# Patient Record
Sex: Female | Born: 1985 | Race: White | Hispanic: No | Marital: Married | Smoking: Never smoker
Health system: Southern US, Community
[De-identification: ages and names within clinical notes are randomized; demographics above are authoritative.]

## PROBLEM LIST (undated history)

## (undated) DIAGNOSIS — N83299 Other ovarian cyst, unspecified side: Secondary | ICD-10-CM

## (undated) HISTORY — PX: WISDOM TOOTH EXTRACTION: SHX21

---

## 2008-11-01 ENCOUNTER — Ambulatory Visit (HOSPITAL_COMMUNITY): Admission: RE | Admit: 2008-11-01 | Discharge: 2008-11-01 | Payer: Self-pay | Admitting: Obstetrics and Gynecology

## 2008-12-21 ENCOUNTER — Ambulatory Visit (HOSPITAL_COMMUNITY): Admission: RE | Admit: 2008-12-21 | Discharge: 2008-12-21 | Payer: Self-pay | Admitting: Gynecology

## 2009-09-18 ENCOUNTER — Inpatient Hospital Stay (HOSPITAL_COMMUNITY): Admission: AD | Admit: 2009-09-18 | Discharge: 2009-09-20 | Payer: Self-pay | Admitting: Obstetrics and Gynecology

## 2010-03-23 LAB — CBC
HCT: 26.6 % — ABNORMAL LOW (ref 36.0–46.0)
Hemoglobin: 11.8 g/dL — ABNORMAL LOW (ref 12.0–15.0)
MCH: 31.7 pg (ref 26.0–34.0)
MCH: 32.1 pg (ref 26.0–34.0)
MCHC: 34.3 g/dL (ref 30.0–36.0)
MCV: 92 fL (ref 78.0–100.0)
MCV: 92.3 fL (ref 78.0–100.0)
Platelets: 176 10*3/uL (ref 150–400)
RBC: 3.71 MIL/uL — ABNORMAL LOW (ref 3.87–5.11)
RBC: 3.9 MIL/uL (ref 3.87–5.11)
RDW: 13.8 % (ref 11.5–15.5)
WBC: 10.3 10*3/uL (ref 4.0–10.5)
WBC: 11.8 10*3/uL — ABNORMAL HIGH (ref 4.0–10.5)

## 2010-03-23 LAB — COMPREHENSIVE METABOLIC PANEL
Chloride: 104 mEq/L (ref 96–112)
GFR calc Af Amer: >60 mL/min (ref >60)
GFR calc non Af Amer: >60 mL/min (ref >60)
Glucose, Bld: 82 mg/dL (ref 70–99)
Potassium: 3.8 mEq/L (ref 3.5–5.1)
Sodium: 134 mEq/L — ABNORMAL LOW (ref 135–145)
Total Protein: 6 g/dL (ref 6.0–8.3)

## 2010-03-23 LAB — URINALYSIS, ROUTINE W REFLEX MICROSCOPIC
Bilirubin Urine: NEGATIVE
Glucose, UA: NEGATIVE mg/dL
Hgb urine dipstick: NEGATIVE
Protein, ur: NEGATIVE mg/dL
Specific Gravity, Urine: >1.03 — ABNORMAL HIGH (ref 1.005–1.030)
Urobilinogen, UA: 0.2 mg/dL (ref 0.0–1.0)
pH: 6 (ref 5.0–8.0)

## 2010-03-23 LAB — URIC ACID: Uric Acid, Serum: 5.3 mg/dL (ref 2.4–7.0)

## 2011-03-01 IMAGING — RF DG HYSTEROGRAM
8 series · 8 of 8 positions shown · non-contrast
Comparison: none

CLINICAL DATA: Infertility.

HYSTEROSALPINGOGRAM
TECHNIQUE: Hysterosalpingogram was performed by the ordering
physician under fluoroscopy.  Fluoroscopic images are submitted for
interpretation following the procedure.
Fluoroscopy Time:  1.9 minutes.

[Series 1: run · 1 of 1 slices shown (1 of 8)]
[im 1/1]
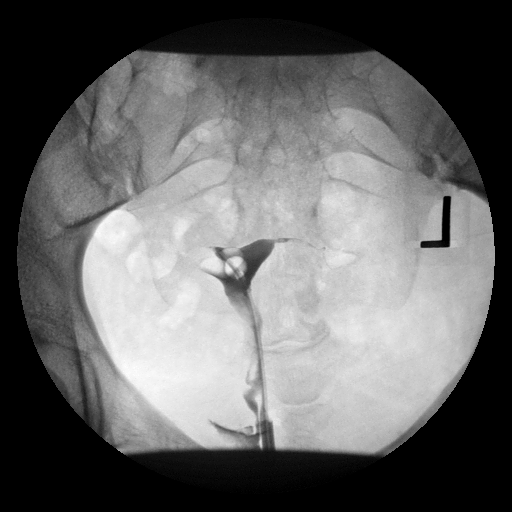

[Series 2: run · 1 of 1 slices shown (2 of 8)]
[im 1/1]
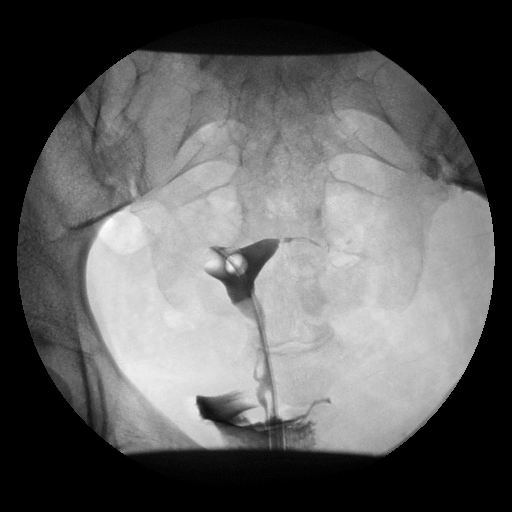

[Series 3: run · 1 of 1 slices shown (3 of 8)]
[im 1/1]
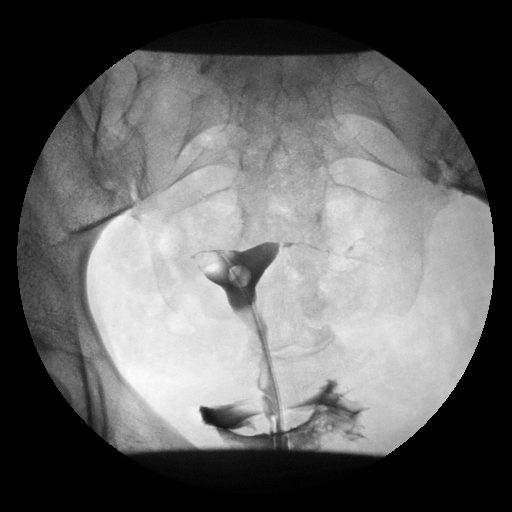

[Series 4: run · 1 of 1 slices shown (4 of 8)]
[im 1/1]
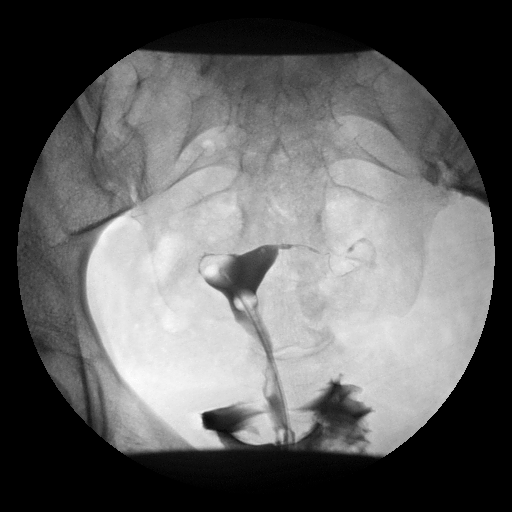

[Series 5: run · 1 of 1 slices shown (5 of 8)]
[im 1/1]
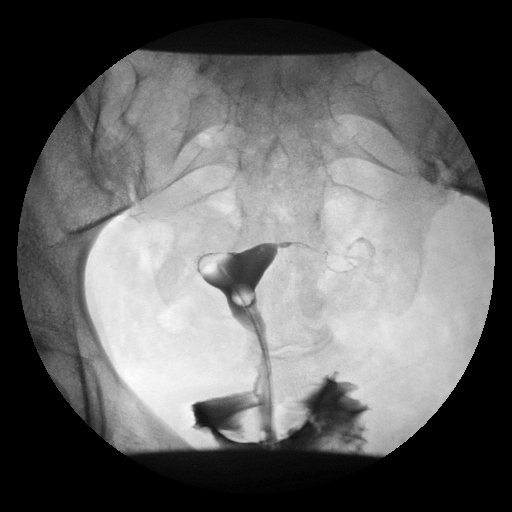

[Series 6: run · 1 of 1 slices shown (6 of 8)]
[im 1/1]
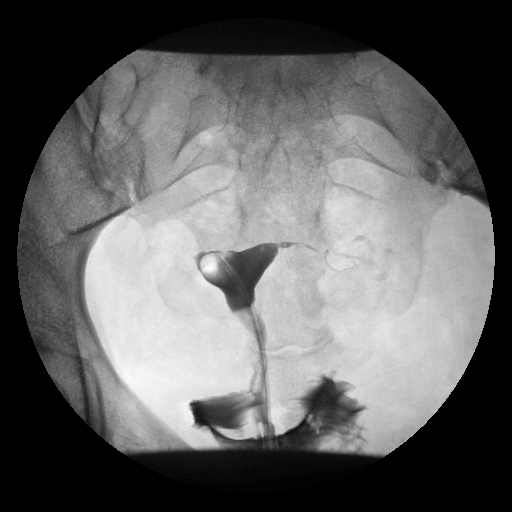

[Series 7: run · 1 of 1 slices shown (7 of 8)]
[im 1/1]
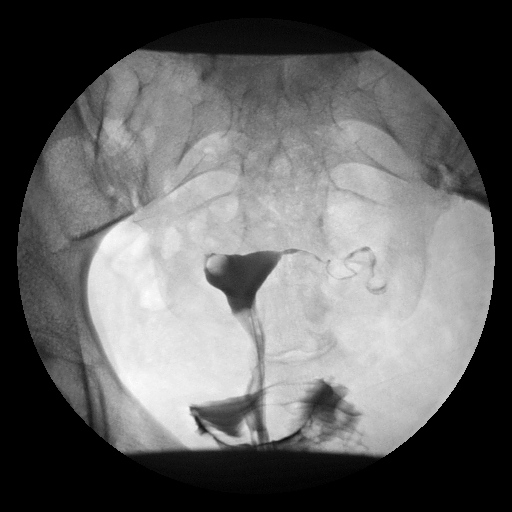

[Series 8: run · 1 of 1 slices shown (8 of 8)]
[im 1/1]
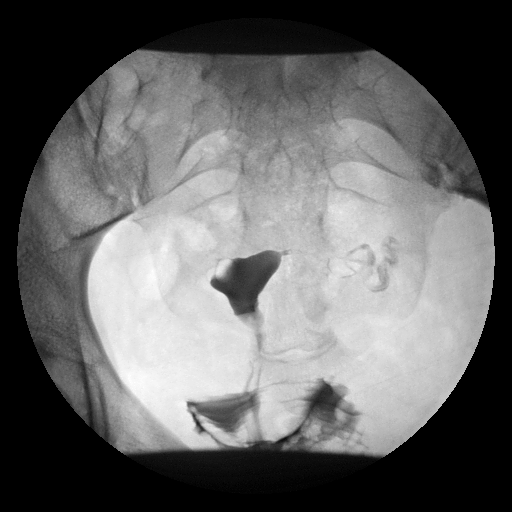

[8 of 8 positions shown; findings below may reference images not displayed]

FINDINGS: The catheter and a small balloon are seen within the
endometrial cavity.  There is a persistent, prominent lobulated
filling defect within the right aspect of the endometrial cavity at
the level of the fundus, and extending into the cornual region on
the right, seen on all images provided.  There is no opacification
of the right fallopian tube.

The left fallopian tube is opacified with contrast and appears
normal in caliber.  No definite free spill of contrast is seen from
the left fallopian tube on the images provided.
IMPRESSION: 1.  Persistent prominent filling defect in the fundal/cornual
region of the endometrial cavity, to the right of midline.
Considerations include submucosal fibroid, endometrial polyp, blood
clot, or less likely a large air bubble.  Consider correlation with
transvaginal pelvic ultrasound if not previously performed.
2.  Nonopacification of the right fallopian tube. The right
fallopian tube may be obstructed by the prominent filling defect
extending into the right cornua.
3.  Normal appearances of the left fallopian tube without definite
free intraperitoneal spill of contrast.  Suggest correlation with
real time findings.  Distal left fallopian tube occlusion is not
excluded.

## 2011-11-24 LAB — OB RESULTS CONSOLE GC/CHLAMYDIA
Chlamydia: NEGATIVE
Gonorrhea: NEGATIVE

## 2011-11-24 LAB — OB RESULTS CONSOLE ANTIBODY SCREEN: Antibody Screen: NEGATIVE

## 2011-11-24 LAB — OB RESULTS CONSOLE RUBELLA ANTIBODY, IGM: Rubella: IMMUNE

## 2012-01-09 NOTE — L&D Delivery Note (Signed)
Delivery Note  SVD viable female Apgars 9,9 over intact perineum.  Placenta delivered spontaneously intact with 3VC.  good support and hemostasis noted and R/V exam confirms.  PH art was sent.  [place] cord blood was not done.  Mother and baby were doing well.  EBL 300cc  Candice Camp, MD

## 2012-05-15 LAB — OB RESULTS CONSOLE GBS: GBS: NEGATIVE

## 2012-05-25 ENCOUNTER — Inpatient Hospital Stay (HOSPITAL_COMMUNITY)
Admission: AD | Admit: 2012-05-25 | Discharge: 2012-05-25 | Disposition: A | Discharge status: Home / Self Care | Payer: BC Managed Care – PPO | Source: Ambulatory Visit | Attending: Obstetrics and Gynecology | Admitting: Obstetrics and Gynecology

## 2012-05-25 ENCOUNTER — Encounter (HOSPITAL_COMMUNITY): Payer: Self-pay | Admitting: *Deleted

## 2012-05-25 DIAGNOSIS — O479 False labor, unspecified: Secondary | ICD-10-CM | POA: Insufficient documentation

## 2012-05-25 NOTE — MAU Note (Signed)
Pt states she is having contractons 5-7 min apart-states had a fast first labor

## 2012-05-31 ENCOUNTER — Encounter (HOSPITAL_COMMUNITY): Payer: Self-pay | Admitting: Family

## 2012-05-31 ENCOUNTER — Observation Stay (HOSPITAL_COMMUNITY)
Admission: AD | Admit: 2012-05-31 | Discharge: 2012-06-01 | DRG: 382 | Disposition: A | Discharge status: Home / Self Care | Payer: BC Managed Care – PPO | Source: Ambulatory Visit | Attending: Obstetrics and Gynecology | Admitting: Obstetrics and Gynecology

## 2012-05-31 DIAGNOSIS — O479 False labor, unspecified: Principal | ICD-10-CM | POA: Diagnosis present

## 2012-05-31 HISTORY — DX: Other ovarian cyst, unspecified side: N83.299

## 2012-05-31 LAB — CBC
HCT: 33.4 % — ABNORMAL LOW (ref 36.0–46.0)
Hemoglobin: 11.5 g/dL — ABNORMAL LOW (ref 12.0–15.0)
MCHC: 34.4 g/dL (ref 30.0–36.0)
WBC: 11.2 10*3/uL — ABNORMAL HIGH (ref 4.0–10.5)

## 2012-05-31 MED ORDER — OXYTOCIN BOLUS FROM INFUSION: 500.0000 mL | INTRAVENOUS | Status: DC

## 2012-05-31 MED ORDER — LIDOCAINE HCL (PF) 1 % IJ SOLN: 30.0000 mL | INTRAMUSCULAR | Status: DC | PRN

## 2012-05-31 MED ORDER — ACETAMINOPHEN 325 MG PO TABS: 650.0000 mg | ORAL_TABLET | ORAL | Status: DC | PRN

## 2012-05-31 MED ORDER — IBUPROFEN 600 MG PO TABS: 600.0000 mg | ORAL_TABLET | Freq: Four times a day (QID) | ORAL | Status: DC | PRN

## 2012-05-31 MED ORDER — ONDANSETRON HCL 4 MG/2ML IJ SOLN: 4.0000 mg | Freq: Four times a day (QID) | INTRAMUSCULAR | Status: DC | PRN

## 2012-05-31 MED ORDER — OXYCODONE-ACETAMINOPHEN 5-325 MG PO TABS: 1.0000 | ORAL_TABLET | ORAL | Status: DC | PRN

## 2012-05-31 MED ORDER — LACTATED RINGERS IV SOLN
INTRAVENOUS | Status: DC
  Administered 2012-05-31: 22:00:00 via INTRAVENOUS

## 2012-05-31 MED ORDER — CITRIC ACID-SODIUM CITRATE 334-500 MG/5ML PO SOLN: 30.0000 mL | ORAL | Status: DC | PRN

## 2012-05-31 MED ORDER — OXYTOCIN 40 UNITS IN LACTATED RINGERS INFUSION - SIMPLE MED: 62.5000 mL/h | INTRAVENOUS | Status: DC

## 2012-05-31 MED ORDER — LACTATED RINGERS IV SOLN: 500.0000 mL | INTRAVENOUS | Status: DC | PRN

## 2012-05-31 MED ORDER — FLEET ENEMA 7-19 GM/118ML RE ENEM: 1.0000 | ENEMA | RECTAL | Status: DC | PRN

## 2012-05-31 NOTE — MAU Note (Signed)
Patient presents to MAU with c/o contraction every 5 minutes for last four hours; denies vaginal bleeding, LOF. Reports +FM.  Denies hx of HSV or MRSA.

## 2012-06-01 NOTE — H&P (Signed)
Pam Hardy is a 27 y.o. female presenting for possible labor at 37+. Watched over night. The next morning cervix unchanged.  fhr cat 1  Maternal Medical History:  Reason for admission: Contractions.   Contractions: Frequency: irregular.    Fetal activity: Perceived fetal activity is normal.    Prenatal complications: no prenatal complications Prenatal Complications - Diabetes: none.    OB History   Grav Para Term Preterm Abortions TAB SAB Ect Mult Living   2 1        1      Past Medical History  Diagnosis Date  . Functional ovarian cysts    Past Surgical History  Procedure Laterality Date  . Wisdom tooth extraction     Family History: family history is not on file. Social History:  reports that she has never smoked. She has never used smokeless tobacco. She reports that she does not drink alcohol or use illicit drugs.   Prenatal Transfer Tool  Maternal Diabetes: No Genetic Screening: Normal Maternal Ultrasounds/Referrals: Normal Fetal Ultrasounds or other Referrals:  None Maternal Substance Abuse:  No Significant Maternal Medications:  None Significant Maternal Lab Results:  None Other Comments:  None  ROS  Dilation: 4 Effacement (%): Thick Station: -2 Exam by:: Dr. Arelia Sneddon Blood pressure 137/69, pulse 80, temperature 98.8 F (37.1 C), temperature source Oral, resp. rate 16, height 5\' 6"  (1.676 m), weight 90.719 kg (200 lb). Maternal Exam:  Uterine Assessment: Contraction strength is mild.  Contraction frequency is irregular.   Abdomen: Fundal height is c/w dates.   Estimated fetal weight is 7.   Fetal presentation: vertex  Pelvis: adequate for delivery.   Cervix: 4 cm long and posterior  Physical Exam  Prenatal labs: ABO, Rh: O/Positive/-- (11/16 0000) Antibody: Negative (11/16 0000) Rubella: Immune (11/16 0000) RPR: NON REACTIVE (05/24 2132)  HBsAg: Negative (11/16 0000)  HIV: Non-reactive (11/16 0000)  GBS: Negative (05/08 0000)    Assessment/Plan: IUP at 37+ with false labor Discharge home  [facility] S 06/01/2012, 9:44 AM

## 2012-06-01 NOTE — Progress Notes (Signed)
Dr.McComb called notified of pt's unchanged SVE, orders received that pt may walk

## 2012-06-01 NOTE — Progress Notes (Addendum)
Dr. Arelia Sneddon called nofitied of pt's SVE after walking, will come to see pt and evaluate

## 2012-06-01 NOTE — Discharge Summary (Signed)
Obstetric Discharge Summary Reason for Admission: onset of labor Prenatal Procedures: none Intrapartum Procedures: false labor sent home undelevered Postpartum Procedures: none Complications-Operative and Postpartum: none Hemoglobin  Date Value Range Status  05/31/2012 11.5* 12.0 - 15.0 g/dL Final     HCT  Date Value Range Status  05/31/2012 33.4* 36.0 - 46.0 % Final    Physical Exam:  General: alert Lochia: appropriate Uterine Fundus: c/w dates Incision: na DVT Evaluation: No evidence of DVT seen on physical exam.  Discharge Diagnoses: False labor-undelivered  Discharge Information: Date: 06/01/2012 Activity: unrestricted Diet: routine Medications: None Condition: stable Instructions: refer to practice specific booklet Discharge to: home   Newborn Data: This patient has no babies on file. Home with undelivered.  Pam Hardy S 06/01/2012, 9:47 AM

## 2012-06-09 ENCOUNTER — Telehealth (HOSPITAL_COMMUNITY): Payer: Self-pay | Admitting: *Deleted

## 2012-06-09 ENCOUNTER — Encounter (HOSPITAL_COMMUNITY): Payer: Self-pay | Admitting: *Deleted

## 2012-06-09 ENCOUNTER — Inpatient Hospital Stay (HOSPITAL_COMMUNITY)
Admission: AD | Admit: 2012-06-09 | Discharge: 2012-06-11 | DRG: 373 | Disposition: A | Discharge status: Home / Self Care | Payer: BC Managed Care – PPO | Source: Ambulatory Visit | Attending: Obstetrics and Gynecology | Admitting: Obstetrics and Gynecology

## 2012-06-09 NOTE — Telephone Encounter (Signed)
Preadmission screen  

## 2012-06-09 NOTE — MAU Note (Signed)
PT SAYS SHE HURT BAD SINCE 830PM.     TODAY IN OFFICE - VE  5 CM. AND HAS BEEN X1 WEEK.      DENIES HSV AND MRSA

## 2012-06-10 ENCOUNTER — Encounter (HOSPITAL_COMMUNITY): Payer: Self-pay | Admitting: Obstetrics

## 2012-06-10 LAB — CBC
HCT: 32.4 % — ABNORMAL LOW (ref 36.0–46.0)
Hemoglobin: 10.9 g/dL — ABNORMAL LOW (ref 12.0–15.0)
MCV: 82.9 fL (ref 78.0–100.0)
RBC: 3.91 MIL/uL (ref 3.87–5.11)
WBC: 9 10*3/uL (ref 4.0–10.5)

## 2012-06-10 MED ORDER — MEASLES, MUMPS & RUBELLA VAC ~~LOC~~ INJ: 0.5000 mL | INJECTION | Freq: Once | SUBCUTANEOUS | Status: DC

## 2012-06-10 MED ORDER — OXYCODONE-ACETAMINOPHEN 5-325 MG PO TABS: 1.0000 | ORAL_TABLET | ORAL | Status: DC | PRN

## 2012-06-10 MED ORDER — OXYTOCIN BOLUS FROM INFUSION: 500.0000 mL | INTRAVENOUS | Status: DC

## 2012-06-10 MED ORDER — LACTATED RINGERS IV SOLN: 500.0000 mL | INTRAVENOUS | Status: DC | PRN

## 2012-06-10 MED ORDER — PRENATAL MULTIVITAMIN CH
1.0000 | ORAL_TABLET | Freq: Every day | ORAL | Status: DC
  Administered 2012-06-10: 1 via ORAL
  Filled 2012-06-10: qty 1

## 2012-06-10 MED ORDER — PHENYLEPHRINE 40 MCG/ML (10ML) SYRINGE FOR IV PUSH (FOR BLOOD PRESSURE SUPPORT)
80.0000 ug | PREFILLED_SYRINGE | INTRAVENOUS | Status: DC | PRN
  Filled 2012-06-10: qty 2

## 2012-06-10 MED ORDER — DIBUCAINE 1 % RE OINT: 1.0000 "application " | TOPICAL_OINTMENT | RECTAL | Status: DC | PRN

## 2012-06-10 MED ORDER — OXYTOCIN 40 UNITS IN LACTATED RINGERS INFUSION - SIMPLE MED
62.5000 mL/h | INTRAVENOUS | Status: DC
  Administered 2012-06-10: 62.5 mL/h via INTRAVENOUS
  Filled 2012-06-10: qty 1000

## 2012-06-10 MED ORDER — LACTATED RINGERS IV SOLN: 500.0000 mL | Freq: Once | INTRAVENOUS | Status: DC

## 2012-06-10 MED ORDER — SIMETHICONE 80 MG PO CHEW: 80.0000 mg | CHEWABLE_TABLET | ORAL | Status: DC | PRN

## 2012-06-10 MED ORDER — IBUPROFEN 600 MG PO TABS: 600.0000 mg | ORAL_TABLET | Freq: Four times a day (QID) | ORAL | Status: DC | PRN

## 2012-06-10 MED ORDER — DIPHENHYDRAMINE HCL 50 MG/ML IJ SOLN: 12.5000 mg | INTRAMUSCULAR | Status: DC | PRN

## 2012-06-10 MED ORDER — LACTATED RINGERS IV SOLN
INTRAVENOUS | Status: DC
  Administered 2012-06-10: 03:00:00 via INTRAVENOUS

## 2012-06-10 MED ORDER — ONDANSETRON HCL 4 MG/2ML IJ SOLN: 4.0000 mg | Freq: Four times a day (QID) | INTRAMUSCULAR | Status: DC | PRN

## 2012-06-10 MED ORDER — LANOLIN HYDROUS EX OINT: TOPICAL_OINTMENT | CUTANEOUS | Status: DC | PRN

## 2012-06-10 MED ORDER — BENZOCAINE-MENTHOL 20-0.5 % EX AERO: 1.0000 "application " | INHALATION_SPRAY | CUTANEOUS | Status: DC | PRN

## 2012-06-10 MED ORDER — FLEET ENEMA 7-19 GM/118ML RE ENEM: 1.0000 | ENEMA | RECTAL | Status: DC | PRN

## 2012-06-10 MED ORDER — FENTANYL 2.5 MCG/ML BUPIVACAINE 1/10 % EPIDURAL INFUSION (WH - ANES): 14.0000 mL/h | INTRAMUSCULAR | Status: DC | PRN

## 2012-06-10 MED ORDER — ONDANSETRON HCL 4 MG/2ML IJ SOLN: 4.0000 mg | INTRAMUSCULAR | Status: DC | PRN

## 2012-06-10 MED ORDER — DIPHENHYDRAMINE HCL 25 MG PO CAPS: 25.0000 mg | ORAL_CAPSULE | Freq: Four times a day (QID) | ORAL | Status: DC | PRN

## 2012-06-10 MED ORDER — LIDOCAINE HCL (PF) 1 % IJ SOLN
30.0000 mL | INTRAMUSCULAR | Status: DC | PRN
  Filled 2012-06-10: qty 30

## 2012-06-10 MED ORDER — IBUPROFEN 600 MG PO TABS
600.0000 mg | ORAL_TABLET | Freq: Four times a day (QID) | ORAL | Status: DC
  Administered 2012-06-10 – 2012-06-11 (×3): 600 mg via ORAL
  Filled 2012-06-10 (×4): qty 1

## 2012-06-10 MED ORDER — EPHEDRINE 5 MG/ML INJ
10.0000 mg | INTRAVENOUS | Status: DC | PRN
  Filled 2012-06-10: qty 2

## 2012-06-10 MED ORDER — ZOLPIDEM TARTRATE 5 MG PO TABS: 5.0000 mg | ORAL_TABLET | Freq: Every evening | ORAL | Status: DC | PRN

## 2012-06-10 MED ORDER — ACETAMINOPHEN 325 MG PO TABS: 650.0000 mg | ORAL_TABLET | ORAL | Status: DC | PRN

## 2012-06-10 MED ORDER — MEDROXYPROGESTERONE ACETATE 150 MG/ML IM SUSP: 150.0000 mg | INTRAMUSCULAR | Status: DC | PRN

## 2012-06-10 MED ORDER — WITCH HAZEL-GLYCERIN EX PADS
1.0000 "application " | MEDICATED_PAD | CUTANEOUS | Status: DC | PRN
  Administered 2012-06-11: 1 via TOPICAL

## 2012-06-10 MED ORDER — PRENATAL MULTIVITAMIN CH: 1.0000 | ORAL_TABLET | Freq: Every day | ORAL | Status: DC

## 2012-06-10 MED ORDER — SENNOSIDES-DOCUSATE SODIUM 8.6-50 MG PO TABS
2.0000 | ORAL_TABLET | Freq: Every day | ORAL | Status: DC
  Administered 2012-06-10: 2 via ORAL

## 2012-06-10 MED ORDER — ONDANSETRON HCL 4 MG PO TABS: 4.0000 mg | ORAL_TABLET | ORAL | Status: DC | PRN

## 2012-06-10 MED ORDER — TETANUS-DIPHTH-ACELL PERTUSSIS 5-2.5-18.5 LF-MCG/0.5 IM SUSP: 0.5000 mL | Freq: Once | INTRAMUSCULAR | Status: DC

## 2012-06-10 MED ORDER — CITRIC ACID-SODIUM CITRATE 334-500 MG/5ML PO SOLN: 30.0000 mL | ORAL | Status: DC | PRN

## 2012-06-10 NOTE — H&P (Signed)
Pam Hardy is a 27 y.o. female presenting for labor.  Admiited in active labor with regular ctxs and 7 cm at 6:30  Pregnancy uncomplicated. History OB History   Grav Para Term Preterm Abortions TAB SAB Ect Mult Living   2 1 1       1      Past Medical History  Diagnosis Date  . Functional ovarian cysts    Past Surgical History  Procedure Laterality Date  . Wisdom tooth extraction     Family History: family history includes Cancer in her paternal aunt and paternal grandfather; Diabetes in her maternal grandfather; Heart attack in her cousin; Heart disease in her maternal grandfather; and Hyperthyroidism in her father. Social History:  reports that she has never smoked. She has never used smokeless tobacco. She reports that she does not drink alcohol or use illicit drugs.   Prenatal Transfer Tool  Maternal Diabetes: No Genetic Screening: Declined Maternal Ultrasounds/Referrals: Normal Fetal Ultrasounds or other Referrals:  None Maternal Substance Abuse:  No Significant Maternal Medications:  None Significant Maternal Lab Results:  None Other Comments:  None  ROS 32 Dilation: 5 Effacement (%): 80 Station: -2 Exam by:: Pam Hardy AROM clear fluid Blood pressure 135/81, pulse 80, temperature 98.4 F (36.9 C), temperature source Oral, resp. rate 18, height 5\' 6"  (1.676 m), weight 90.719 kg (200 lb). Exam Physical Exam  Prenatal labs: ABO, Rh: O/Positive/-- (11/16 0000) Antibody: Negative (11/16 0000) Rubella: Immune (11/16 0000) RPR: NON REACTIVE (05/24 2132)  HBsAg: Negative (11/16 0000)  HIV: Non-reactive (11/16 0000)  GBS: Negative (05/08 0000)   Assessment/Plan: IUP at term in active labor Anticipate SVD   Pam Hardy C 06/10/2012, 8:24 AM

## 2012-06-10 NOTE — Lactation Note (Signed)
This note was copied from the chart of Pam Hardy. Lactation Consultation Note  Patient Name: Pam Margherita Collyer [id number] Date: 06/10/2012 Reason for consult: Initial assessment Mom plans to pump and bottle feed. Her 1st baby did not latch well so she pumped and bottle fed for 9 months. She does not want to put this baby to the breast, she plans to pump and bottle exclusively. RN set up DEBP for Mom and she pumped 11 ml of EBM (6  Ml from right breast, 5 ml from Left). Mom is giving this to baby via bottle with slow flow nipple. Advised to pump every 3 hours for 15 minutes to encourage milk production, give the baby back any amount of EBM she has available. Mom has DEBP at home. Lactation brochure left for review. Advised of OP services and support group. Advised to ask for assist as needed or if she decides to put baby to the breast.   Maternal Data Formula Feeding for Exclusion: Yes Reason for exclusion: Mother's choice to formula and breast feed on admission (Mom plans to pump and bottle feed) Infant to breast within first hour of birth: No Breastfeeding delayed due to:: Maternal status (Mom declined to put baby to breast/plans pump &BO) Has patient been taught Hand Expression?: Yes Does the patient have breastfeeding experience prior to this delivery?: Yes  Feeding    LATCH Score/Interventions                      Lactation Tools Discussed/Used Tools: Pump Breast pump type: Double-Electric Breast Pump Pump Review: Setup, frequency, and cleaning;Milk Storage Initiated by:: KG Date initiated:: 06/10/12   Consult Status Consult Status: Follow-up Date: 06/11/12 Follow-up type: In-patient    Alfred Levins 06/10/2012, 2:16 PM

## 2012-06-11 LAB — CBC
MCV: 84.2 fL (ref 78.0–100.0)
Platelets: 172 10*3/uL (ref 150–400)
RBC: 3.73 MIL/uL — ABNORMAL LOW (ref 3.87–5.11)
WBC: 9.9 10*3/uL (ref 4.0–10.5)

## 2012-06-11 NOTE — Progress Notes (Signed)
UR chart review completed.  

## 2012-06-11 NOTE — Discharge Summary (Signed)
Obstetric Discharge Summary Reason for Admission: onset of labor Prenatal Procedures: ultrasound Intrapartum Procedures: spontaneous vaginal delivery Postpartum Procedures: none Complications-Operative and Postpartum: none Hemoglobin  Date Value Range Status  06/11/2012 10.4* 12.0 - 15.0 g/dL Final     HCT  Date Value Range Status  06/11/2012 31.4* 36.0 - 46.0 % Final    Physical Exam:  General: alert and cooperative Lochia: appropriate Uterine Fundus: firm Incision: perineum intact DVT Evaluation: No evidence of DVT seen on physical exam. Negative Homan's sign. No cords or calf tenderness. No significant calf/ankle edema.  Discharge Diagnoses: Term Pregnancy-delivered  Discharge Information: Date: 06/11/2012 Activity: pelvic rest Diet: routine Medications: PNV's and Ibupropen Condition: stable Instructions: refer to practice specific booklet Discharge to: home   Newborn Data: Live born female  Birth Weight: 8 lb 5.9 oz (3795 g) APGAR: 9, 9  Home with mother.  Pam Hardy G 06/11/2012, 7:54 AM

## 2012-06-11 NOTE — Progress Notes (Cosign Needed)
Post Partum Day 1 Subjective: no complaints, up ad lib, voiding and tolerating PO  Objective: Blood pressure 119/82, pulse 75, temperature 98.2 F (36.8 C), temperature source Oral, resp. rate 17, height 5\' 6"  (1.676 m), weight 200 lb (90.719 kg), SpO2 97.00%, unknown if currently breastfeeding.  Physical Exam:  General: alert and cooperative Lochia: appropriate Uterine Fundus: firm Incision: perineum intact DVT Evaluation: No evidence of DVT seen on physical exam. Negative Homan's sign. No cords or calf tenderness. No significant calf/ankle edema.   Recent Labs  06/10/12 0310 06/11/12 0600  HGB 10.9* 10.4*  HCT 32.4* 31.4*    Assessment/Plan: Discharge home and Circumcision prior to discharge   LOS: 2 days   Pam Hardy G 06/11/2012, 7:49 AM

## 2012-06-13 ENCOUNTER — Inpatient Hospital Stay (HOSPITAL_COMMUNITY): Admission: RE | Admit: 2012-06-13 | Payer: BC Managed Care – PPO | Source: Ambulatory Visit

## 2013-03-20 ENCOUNTER — Encounter (HOSPITAL_COMMUNITY): Payer: Self-pay | Admitting: Emergency Medicine

## 2013-03-20 ENCOUNTER — Emergency Department (HOSPITAL_COMMUNITY)
Admission: EM | Admit: 2013-03-20 | Discharge: 2013-03-20 | Disposition: A | Discharge status: Home / Self Care | Payer: BC Managed Care – PPO | Source: Home / Self Care | Attending: Emergency Medicine | Admitting: Emergency Medicine

## 2013-03-20 DIAGNOSIS — J329 Chronic sinusitis, unspecified: Secondary | ICD-10-CM

## 2013-03-20 MED ORDER — DOXYCYCLINE HYCLATE 100 MG PO CAPS: 100.0000 mg | ORAL_CAPSULE | Freq: Two times a day (BID) | ORAL | Status: AC

## 2013-03-20 MED ORDER — FLUTICASONE PROPIONATE 50 MCG/ACT NA SUSP: 2.0000 | Freq: Two times a day (BID) | NASAL | Status: AC

## 2013-03-20 MED ORDER — PREDNISONE (PAK) 5 MG PO TABS: ORAL_TABLET | ORAL | Status: AC

## 2013-03-20 NOTE — Discharge Instructions (Signed)

## 2013-03-20 NOTE — ED Provider Notes (Signed)
CSN: [id number]     Arrival date & time 03/20/13  1640 History   First MD Initiated Contact with Patient 03/20/13 1754     Chief Complaint  Patient presents with  . Facial Pain   (Consider location/radiation/quality/duration/timing/severity/associated sxs/prior Treatment) HPI Comments: 28 year old female presents complaining of nasal congestion, green nasal discharge, and sinus pressure. This began 10 days ago with nasal congestion. She has been taking over-the-counter medications every single day with mild temporary relief of her symptoms. 3 days ago she started to have more pressure and pain in her face. She has the pain in her bilateral cheeks and up to the bridge of her nose. She's here  because her symptoms have been getting worse for 10 days now and she wanted to see if she has a sinus infection or something that requires antibiotics. She denies fever, chills, NVD, sore throat, cough, chest pain, shortness of breath. No recent travel or sick contacts.   Past Medical History  Diagnosis Date  . Functional ovarian cysts    Past Surgical History  Procedure Laterality Date  . Wisdom tooth extraction     Family History  Problem Relation Age of Onset  . Hyperthyroidism Father   . Cancer Paternal Aunt     breast with mets  . Heart disease Maternal Grandfather   . Diabetes Maternal Grandfather   . Cancer Paternal Grandfather     lung  . Heart attack Cousin    History  Substance Use Topics  . Smoking status: Never Smoker   . Smokeless tobacco: Never Used  . Alcohol Use: No   OB History   Grav Para Term Preterm Abortions TAB SAB Ect Mult Living   2 2 2       2      Review of Systems  Constitutional: Negative for fever and chills.  HENT: Positive for congestion, postnasal drip, rhinorrhea and sinus pressure. Negative for ear pain, nosebleeds and sore throat.   Eyes: Negative for visual disturbance.  Respiratory: Negative for cough and shortness of breath.   Cardiovascular:  Negative for chest pain, palpitations and leg swelling.  Gastrointestinal: Negative for nausea, vomiting and abdominal pain.  Endocrine: Negative for polydipsia and polyuria.  Genitourinary: Negative for dysuria, urgency and frequency.  Musculoskeletal: Negative for arthralgias and myalgias.  Skin: Negative for rash.  Neurological: Negative for dizziness, weakness and light-headedness.    Allergies  Augmentin; Keflex; and Erythromycin  Home Medications   Current Outpatient Rx  Name  Route  Sig  Dispense  Refill  . doxycycline (VIBRAMYCIN) 100 MG capsule   Oral   Take 1 capsule (100 mg total) by mouth 2 (two) times daily.   20 capsule   0   . fluticasone (FLONASE) 50 MCG/ACT nasal spray   Each Nare   Place 2 sprays into both nostrils 2 (two) times daily.   16 g   2   . predniSONE (STERAPRED UNI-PAK) 5 MG TABS tablet      Use as directed on package instructions   21 tablet   0   . Prenatal Vit-Fe Fumarate-FA (PRENATAL MULTIVITAMIN) TABS   Oral   Take 1 tablet by mouth at bedtime.          BP 140/89  Pulse 93  Temp(Src) 98.1 F (36.7 C) (Oral)  Resp 16  SpO2 100%  LMP 03/09/2013  Breastfeeding? No Physical Exam  Nursing note and vitals reviewed. Constitutional: She is oriented to person, place, and time. Vital signs are normal. She  appears well-developed and well-nourished. No distress.  HENT:  Head: Normocephalic and atraumatic.  Nose: Mucosal edema, rhinorrhea and sinus tenderness present. Right sinus exhibits maxillary sinus tenderness. Right sinus exhibits no frontal sinus tenderness. Left sinus exhibits maxillary sinus tenderness. Left sinus exhibits no frontal sinus tenderness.  Mouth/Throat: Uvula is midline, oropharynx is clear and moist and mucous membranes are normal.  Pulmonary/Chest: Effort normal. No respiratory distress.  Lymphadenopathy:       Head (right side): No submandibular and no tonsillar adenopathy present.       Head (left side): No  submandibular and no tonsillar adenopathy present.    She has no cervical adenopathy.  Neurological: She is alert and oriented to person, place, and time. She has normal strength. Coordination normal.  Skin: Skin is warm and dry. No rash noted. She is not diaphoretic.  Psychiatric: She has a normal mood and affect. Judgment normal.    ED Course  Procedures (including critical care time) Labs Review Labs Reviewed - No data to display Imaging Review No results found.   MDM   1. Sinusitis    At 10 days with progressive worsening, will prescribe antibiotics. Also treating symptomatically. Followup when necessary if not improving after 5 days.   Meds ordered this encounter  Medications  . doxycycline (VIBRAMYCIN) 100 MG capsule    Sig: Take 1 capsule (100 mg total) by mouth 2 (two) times daily.    Dispense:  20 capsule    Refill:  0    Order Specific Question:  Supervising Provider    Answer:  Clementeen GrahamOREY, EVAN, S [id number]]  . predniSONE (STERAPRED UNI-PAK) 5 MG TABS tablet    Sig: Use as directed on package instructions    Dispense:  21 tablet    Refill:  0    Order Specific Question:  Supervising Provider    Answer:  Clementeen GrahamOREY, EVAN, S [[id number]]  . fluticasone (FLONASE) 50 MCG/ACT nasal spray    Sig: Place 2 sprays into both nostrils 2 (two) times daily.    Dispense:  16 g    Refill:  2    Order Specific Question:  Supervising Provider    Answer:  Clementeen GrahamOREY, EVAN, Kathie RhodesS [[id number]]       Pam GoodZachary H Mirenda Baltazar, PA-C 03/20/13 1824

## 2013-03-20 NOTE — ED Notes (Signed)
C/o onset 3-10 of facial pain, pressure, green nasal secretions

## 2013-03-21 NOTE — ED Provider Notes (Signed)
Medical screening examination/treatment/procedure(s) were performed by non-physician practitioner and as supervising physician I was immediately available for consultation/collaboration.  Leslee Homeavid Aimar Borghi, M.D.  Reuben Likesavid C Alexine Pilant, MD 03/21/13 [phone]

## 2013-11-09 ENCOUNTER — Encounter (HOSPITAL_COMMUNITY): Payer: Self-pay | Admitting: Emergency Medicine

## 2014-05-24 ENCOUNTER — Ambulatory Visit: Payer: BC Managed Care – PPO | Admitting: Podiatry
